# Patient Record
Sex: Female | Born: 1985 | Race: White | Hispanic: No | Marital: Married | Smoking: Never smoker
Health system: Southern US, Community
[De-identification: ages and names within clinical notes are randomized; demographics above are authoritative.]

## PROBLEM LIST (undated history)

## (undated) DIAGNOSIS — K2970 Gastritis, unspecified, without bleeding: Secondary | ICD-10-CM

## (undated) HISTORY — DX: Gastritis, unspecified, without bleeding: K29.70

---
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
## 2014-11-27 ENCOUNTER — Telehealth: Payer: Self-pay | Admitting: *Deleted

## 2014-11-27 DIAGNOSIS — O219 Vomiting of pregnancy, unspecified: Secondary | ICD-10-CM

## 2014-11-27 MED ORDER — DOXYLAMINE-PYRIDOXINE 10-10 MG PO TBEC: 2.0000 | DELAYED_RELEASE_TABLET | Freq: Every evening | ORAL | Status: AC

## 2014-11-27 NOTE — Telephone Encounter (Signed)
Pt is scheduled for a NOB intake and wants something for nausea.  RX for Diclegis sent to [facility] pharmacy

## 2014-12-12 ENCOUNTER — Ambulatory Visit (INDEPENDENT_AMBULATORY_CARE_PROVIDER_SITE_OTHER): Payer: BLUE CROSS/BLUE SHIELD

## 2014-12-12 ENCOUNTER — Other Ambulatory Visit: Payer: Self-pay | Admitting: Advanced Practice Midwife

## 2014-12-12 ENCOUNTER — Ambulatory Visit (INDEPENDENT_AMBULATORY_CARE_PROVIDER_SITE_OTHER): Payer: BLUE CROSS/BLUE SHIELD | Admitting: Advanced Practice Midwife

## 2014-12-12 ENCOUNTER — Encounter: Payer: Self-pay | Admitting: Advanced Practice Midwife

## 2014-12-12 VITALS — BP 129/83 | HR 113 | Ht 67.0 in | Wt 146.0 lb

## 2014-12-12 DIAGNOSIS — O208 Other hemorrhage in early pregnancy: Secondary | ICD-10-CM | POA: Diagnosis not present

## 2014-12-12 DIAGNOSIS — O09891 Supervision of other high risk pregnancies, first trimester: Secondary | ICD-10-CM | POA: Diagnosis not present

## 2014-12-12 DIAGNOSIS — Z3689 Encounter for other specified antenatal screening: Secondary | ICD-10-CM

## 2014-12-12 DIAGNOSIS — O30031 Twin pregnancy, monochorionic/diamniotic, first trimester: Secondary | ICD-10-CM

## 2014-12-12 DIAGNOSIS — Z3A01 Less than 8 weeks gestation of pregnancy: Secondary | ICD-10-CM

## 2014-12-12 DIAGNOSIS — Z3201 Encounter for pregnancy test, result positive: Secondary | ICD-10-CM

## 2014-12-12 DIAGNOSIS — Z3491 Encounter for supervision of normal pregnancy, unspecified, first trimester: Secondary | ICD-10-CM

## 2014-12-12 DIAGNOSIS — O09899 Supervision of other high risk pregnancies, unspecified trimester: Secondary | ICD-10-CM | POA: Insufficient documentation

## 2014-12-12 DIAGNOSIS — Z36 Encounter for antenatal screening of mother: Secondary | ICD-10-CM

## 2014-12-12 LAB — US OB COMP ADDL GEST LESS 14 WKS

## 2014-12-12 NOTE — Patient Instructions (Signed)
First Trimester of Pregnancy The first trimester of pregnancy is from week 1 until the end of week 12 (months 1 through 3). A week after a sperm fertilizes an egg, the egg will implant on the wall of the uterus. This embryo will begin to develop into a baby. Genes from you and your partner are forming the baby. The female genes determine whether the baby is a boy or a girl. At 6-8 weeks, the eyes and face are formed, and the heartbeat can be seen on ultrasound. At the end of 12 weeks, all the baby's organs are formed.  Now that you are pregnant, you will want to do everything you can to have a healthy baby. Two of the most important things are to get good prenatal care and to follow your health care provider's instructions. Prenatal care is all the medical care you receive before the baby's birth. This care will help prevent, find, and treat any problems during the pregnancy and childbirth. BODY CHANGES Your body goes through many changes during pregnancy. The changes vary from woman to woman.   You may gain or lose a couple of pounds at first.  You may feel sick to your stomach (nauseous) and throw up (vomit). If the vomiting is uncontrollable, call your health care provider.  You may tire easily.  You may develop headaches that can be relieved by medicines approved by your health care provider.  You may urinate more often. Painful urination may mean you have a bladder infection.  You may develop heartburn as a result of your pregnancy.  You may develop constipation because certain hormones are causing the muscles that push waste through your intestines to slow down.  You may develop hemorrhoids or swollen, bulging veins (varicose veins).  Your breasts may begin to grow larger and become tender. Your nipples may stick out more, and the tissue that surrounds them (areola) may become darker.  Your gums may bleed and may be sensitive to brushing and flossing.  Dark spots or blotches (chloasma,  mask of pregnancy) may develop on your face. This will likely fade after the baby is born.  Your menstrual periods will stop.  You may have a loss of appetite.  You may develop cravings for certain kinds of food.  You may have changes in your emotions from day to day, such as being excited to be pregnant or being concerned that something may go wrong with the pregnancy and baby.  You may have more vivid and strange dreams.  You may have changes in your hair. These can include thickening of your hair, rapid growth, and changes in texture. Some women also have hair loss during or after pregnancy, or hair that feels dry or thin. Your hair will most likely return to normal after your baby is born. WHAT TO EXPECT AT YOUR PRENATAL VISITS During a routine prenatal visit:  You will be weighed to make sure you and the baby are growing normally.  Your blood pressure will be taken.  Your abdomen will be measured to track your baby's growth.  The fetal heartbeat will be listened to starting around week 10 or 12 of your pregnancy.  Test results from any previous visits will be discussed. Your health care provider may ask you:  How you are feeling.  If you are feeling the baby move.  If you have had any abnormal symptoms, such as leaking fluid, bleeding, severe headaches, or abdominal cramping.  If you have any questions. Other tests   that may be performed during your first trimester include:  Blood tests to find your blood type and to check for the presence of any previous infections. They will also be used to check for low iron levels (anemia) and Rh antibodies. Later in the pregnancy, blood tests for diabetes will be done along with other tests if problems develop.  Urine tests to check for infections, diabetes, or protein in the urine.  An ultrasound to confirm the proper growth and development of the baby.  An amniocentesis to check for possible genetic problems.  Fetal screens for  spina bifida and Down syndrome.  You may need other tests to make sure you and the baby are doing well. HOME CARE INSTRUCTIONS  Medicines  Follow your health care provider's instructions regarding medicine use. Specific medicines may be either safe or unsafe to take during pregnancy.  Take your prenatal vitamins as directed.  If you develop constipation, try taking a stool softener if your health care provider approves. Diet  Eat regular, well-balanced meals. Choose a variety of foods, such as meat or vegetable-based protein, fish, milk and low-fat dairy products, vegetables, fruits, and whole grain breads and cereals. Your health care provider will help you determine the amount of weight gain that is right for you.  Avoid raw meat and uncooked cheese. These carry germs that can cause birth defects in the baby.  Eating four or five small meals rather than three large meals a day may help relieve nausea and vomiting. If you start to feel nauseous, eating a few soda crackers can be helpful. Drinking liquids between meals instead of during meals also seems to help nausea and vomiting.  If you develop constipation, eat more high-fiber foods, such as fresh vegetables or fruit and whole grains. Drink enough fluids to keep your urine clear or pale yellow. Activity and Exercise  Exercise only as directed by your health care provider. Exercising will help you:  Control your weight.  Stay in shape.  Be prepared for labor and delivery.  Experiencing pain or cramping in the lower abdomen or low back is a good sign that you should stop exercising. Check with your health care provider before continuing normal exercises.  Try to avoid standing for long periods of time. Move your legs often if you must stand in one place for a long time.  Avoid heavy lifting.  Wear low-heeled shoes, and practice good posture.  You may continue to have sex unless your health care provider directs you  otherwise. Relief of Pain or Discomfort  Wear a good support bra for breast tenderness.   Take warm sitz baths to soothe any pain or discomfort caused by hemorrhoids. Use hemorrhoid cream if your health care provider approves.   Rest with your legs elevated if you have leg cramps or low back pain.  If you develop varicose veins in your legs, wear support hose. Elevate your feet for 15 minutes, 3-4 times a day. Limit salt in your diet. Prenatal Care  Schedule your prenatal visits by the twelfth week of pregnancy. They are usually scheduled monthly at first, then more often in the last 2 months before delivery.  Write down your questions. Take them to your prenatal visits.  Keep all your prenatal visits as directed by your health care provider. Safety  Wear your seat belt at all times when driving.  Make a list of emergency phone numbers, including numbers for family, friends, the hospital, and police and fire departments. General Tips    Ask your health care provider for a referral to a local prenatal education class. Begin classes no later than at the beginning of month 6 of your pregnancy.  Ask for help if you have counseling or nutritional needs during pregnancy. Your health care provider can offer advice or refer you to specialists for help with various needs.  Do not use hot tubs, steam rooms, or saunas.  Do not douche or use tampons or scented sanitary pads.  Do not cross your legs for long periods of time.  Avoid cat litter boxes and soil used by cats. These carry germs that can cause birth defects in the baby and possibly loss of the fetus by miscarriage or stillbirth.  Avoid all smoking, herbs, alcohol, and medicines not prescribed by your health care provider. Chemicals in these affect the formation and growth of the baby.  Schedule a dentist appointment. At home, brush your teeth with a soft toothbrush and be gentle when you floss. SEEK MEDICAL CARE IF:   You have  dizziness.  You have mild pelvic cramps, pelvic pressure, or nagging pain in the abdominal area.  You have persistent nausea, vomiting, or diarrhea.  You have a bad smelling vaginal discharge.  You have pain with urination.  You notice increased swelling in your face, hands, legs, or ankles. SEEK IMMEDIATE MEDICAL CARE IF:   You have a fever.  You are leaking fluid from your vagina.  You have spotting or bleeding from your vagina.  You have severe abdominal cramping or pain.  You have rapid weight gain or loss.  You vomit blood or material that looks like coffee grounds.  You are exposed to [place] measles and have never had them.  You are exposed to fifth disease or chickenpox.  You develop a severe headache.  You have shortness of breath.  You have any kind of trauma, such as from a fall or a car accident. Document Released: 08/16/2001 Document Revised: 01/06/2014 Document Reviewed: 07/02/2013 [facility] Patient Information 2015 [place], [place]. This information is not intended to replace advice given to you by your health care provider. Make sure you discuss any questions you have with your health care provider.  Eating Plan for Hyperemesis Gravidarum Severe cases of hyperemesis gravidarum can lead to dehydration and malnutrition. The hyperemesis eating plan is one way to lessen the symptoms of nausea and vomiting. It is often used with prescribed medicines to control your symptoms.  WHAT CAN I DO TO RELIEVE MY SYMPTOMS? Listen to your body. Everyone is different and has different preferences. Find what works best for you. Some of the following things may help:  Eat and drink slowly.  Eat 5-6 small meals daily instead of 3 large meals.   Eat crackers before you get out of bed in the morning.   Starchy foods are usually well tolerated (such as cereal, toast, bread, potatoes, pasta, rice, and pretzels).   Ginger may help with nausea. Add  tsp ground ginger to hot  tea or choose ginger tea.   Try drinking 100% fruit juice or an electrolyte drink.  Continue to take your prenatal vitamins as directed by your health care provider. If you are having trouble taking your prenatal vitamins, talk with your health care provider about different options.  Include at least 1 serving of protein with your meals and snacks (such as meats or poultry, beans, nuts, eggs, or yogurt). Try eating a protein-rich snack before bed (such as cheese and crackers or a half [place] or  butter sandwich). °WHAT THINGS SHOULD I AVOID TO REDUCE MY SYMPTOMS? °The following things may help reduce your symptoms: °· Avoid foods with strong smells. Try eating meals in well-ventilated areas that are free of odors. °· Avoid drinking water or other beverages with meals. Try not to drink anything less than 30 minutes before and after meals. °· Avoid drinking more than 1 cup of fluid at a time. °· Avoid fried or high-fat foods, such as butter and cream sauces. °· Avoid spicy foods. °· Avoid skipping meals the best you can. Nausea can be more intense on an empty stomach. If you cannot tolerate food at that time, do not force it. Try sucking on ice chips or other frozen items and make up the calories later. °· Avoid lying down within 2 hours after eating. °Document Released: 06/19/2007 Document Revised: 08/27/2013 Document Reviewed: 06/26/2013 °ExitCare® Patient Information ©2015 [employer] LLC. This information is not intended to replace advice given to you by your health care provider. Make sure you discuss any questions you have with your health care provider. ° °

## 2014-12-12 NOTE — Progress Notes (Signed)
   Subjective:    Molly Scott is a [id number] [id number] by certain LMP being seen today for her first obstetrical visit.  Her obstetrical history is significant for C/S x 2. First one for NRFHTs. Second one planned repeat. . Patient does intend to breast feed. Pregnancy history fully reviewed.  Patient reports nausea, no bleeding, no cramping and vomiting. N/V now adequately controlled w/ Diclegis.   Pap < 1 year ago, normal.  Filed Vitals:   12/12/14 0923 12/12/14 0943  BP: 129/83   Pulse: 113   Height:  5\' 7"  (1.702 m)  Weight: 146 lb (66.225 kg)     HISTORY: OB History  Gravida Para Term Preterm AB SAB TAB Ectopic Multiple Living  3 2 2       2     # Outcome Date GA Lbr Len/2nd Weight Sex Delivery Anes PTL Lv  3 Current           2 Term 02/09/11 3349w0d  7 lb 2 oz (3.232 kg) M CS-Unspec   Y  1 Term 08/25/08 5366w0d  8 lb 2 oz (3.685 kg) F CS-Unspec  N Y     Past Medical History  Diagnosis Date  . Gastritis    Past Surgical History  Procedure Laterality Date  . Cesarean section      X2  . Tonsillectomy  2004   No family history on file.   Exam    Uterus:   Possible twin gestation. FHTs x 2  Pelvic Exam:    Perineum: No Hemorrhoids, Normal Perineum   Vulva: normal   Vagina:  Not examined   pH: NA   Cervix: Not examined   Adnexa: not evaluated   Bony Pelvis: Unproven  System: Breast:  normal appearance, no masses. Normal tenderness for pregnancy.   Skin: normal coloration and turgor, no rashes    Neurologic: oriented, normal mood, grossly non-focal   Extremities: normal strength, tone, and muscle mass   HEENT sclera clear, anicteric and thyroid without masses   Mouth/Teeth dental hygiene good   Neck supple and no masses   Cardiovascular: regular rate and rhythm, no murmurs or gallops   Respiratory:  appears well, vitals normal, no respiratory distress, acyanotic, normal RR, chest clear, no wheezing, crepitations, rhonchi, normal symmetric air entry   Abdomen:  soft, non-tender; bowel sounds normal; no masses,  no organomegaly   Urinary: urethral meatus normal      Assessment:    Pregnancy: G3P2002 1. Establish gestational age, ultrasound   - US OB Comp Less 14 Wks; Future - US OB Transvaginal; Future - Prenatal (OB Panel) - HIV antibody  2. Normal pregnancy in first trimester   - Culture, OB Urine - GC/Chlamydia Probe Amp  3. N/V of pregnancy   Plan:   Initial labs drawn. Prenatal vitamins. Problem list reviewed and updated. Genetic Screening discussed First Screen: declined. Ultrasound discussed; Dating/verify twins: ordered. Follow up in 4 weeks. Plan C/S due to C/S x 2, no Vaginal deliveries.    Molly Scott, Molly Scott 12/12/2014

## 2014-12-12 NOTE — Progress Notes (Signed)
Initial prenatal visit. Pt has not had a cycle for 4 years until 10/18/14. Bedside US shows 2 FHR. FHR of 166 and 164. CRL of 12.559mm-[redacted]w[redacted]d and 17.311mm-[redacted]w[redacted]d. Will send pt for formal US.

## 2014-12-13 LAB — HIV ANTIBODY (ROUTINE TESTING W REFLEX): HIV 1&2 Ab, 4th Generation: NONREACTIVE

## 2014-12-14 LAB — CULTURE, OB URINE
Colony Count: NO GROWTH
Organism ID, Bacteria: NO GROWTH

## 2014-12-15 DIAGNOSIS — O30031 Twin pregnancy, monochorionic/diamniotic, first trimester: Secondary | ICD-10-CM | POA: Insufficient documentation

## 2014-12-15 LAB — GC/CHLAMYDIA PROBE AMP
CT PROBE, AMP APTIMA: NEGATIVE
GC Probe RNA: NEGATIVE

## 2014-12-15 LAB — OBSTETRIC PANEL
Antibody Screen: NEGATIVE
BASOS ABS: 0 10*3/uL (ref 0.0–0.1)
Basophils Relative: 0 % (ref 0–1)
Eosinophils Absolute: 0 10*3/uL (ref 0.0–0.7)
Eosinophils Relative: 0 % (ref 0–5)
HEMATOCRIT: 39.3 % (ref 36.0–46.0)
HEP B S AG: NEGATIVE
Hemoglobin: 13.5 g/dL (ref 12.0–15.0)
LYMPHS ABS: 1.5 10*3/uL (ref 0.7–4.0)
Lymphocytes Relative: 18 % (ref 12–46)
MCH: 30.8 pg (ref 26.0–34.0)
MCHC: 34.4 g/dL (ref 30.0–36.0)
MCV: 89.5 fL (ref 78.0–100.0)
MONOS PCT: 7 % (ref 3–12)
MPV: 9.9 fL (ref 8.6–12.4)
Monocytes Absolute: 0.6 10*3/uL (ref 0.1–1.0)
Neutro Abs: 6.3 10*3/uL (ref 1.7–7.7)
Neutrophils Relative %: 75 % (ref 43–77)
Platelets: 222 10*3/uL (ref 150–400)
RBC: 4.39 MIL/uL (ref 3.87–5.11)
RDW: 13.1 % (ref 11.5–15.5)
RH TYPE: POSITIVE
Rubella: 18.8 Index — ABNORMAL HIGH (ref <0.90)
WBC: 8.4 10*3/uL (ref 4.0–10.5)

## 2014-12-22 ENCOUNTER — Telehealth: Payer: Self-pay | Admitting: *Deleted

## 2014-12-22 DIAGNOSIS — O219 Vomiting of pregnancy, unspecified: Secondary | ICD-10-CM

## 2014-12-22 MED ORDER — ONDANSETRON HCL 4 MG PO TABS: 4.0000 mg | ORAL_TABLET | Freq: Three times a day (TID) | ORAL | Status: AC | PRN

## 2014-12-22 NOTE — Telephone Encounter (Signed)
Pt requesting Zofran instead of Diclegis.  She states that it does not help her.  Spoke with Dr Marice Potterove who authorized the new RX.

## 2014-12-26 ENCOUNTER — Encounter: Payer: Self-pay | Admitting: Advanced Practice Midwife

## 2014-12-29 NOTE — Telephone Encounter (Signed)
Pt notified about her question concerning her subchorionic hemorrhage.  Suggest no heavy lifting and to call if any bleeding.  Explained that this will usually resolve itself by the second trimester but will follow her closer with U/S

## 2015-01-02 ENCOUNTER — Telehealth: Payer: Self-pay

## 2015-01-02 NOTE — Telephone Encounter (Signed)
Per patient request transfer records to [facility] in [place]. Faxed records.

## 2015-01-09 ENCOUNTER — Encounter: Payer: BLUE CROSS/BLUE SHIELD | Admitting: Advanced Practice Midwife

## 2015-10-17 ENCOUNTER — Encounter (HOSPITAL_COMMUNITY): Payer: Self-pay | Admitting: *Deleted

## 2016-08-20 IMAGING — US US OB COMP LESS 14 WK
1 series · 14 of 28 positions shown · non-contrast
Comparison: None.

CLINICAL DATA: Positive pregnancy test. Twin pregnancy. Gestational
age by LMP of 07/25/2015

EXAM:
TWIN OBSTETRIC <14WK US AND TRANSVAGINAL OB US

[Series 1: us ob comp less 14 wk · 0.20mm/px · 14 of 62 slices shown]
[im 3/62]
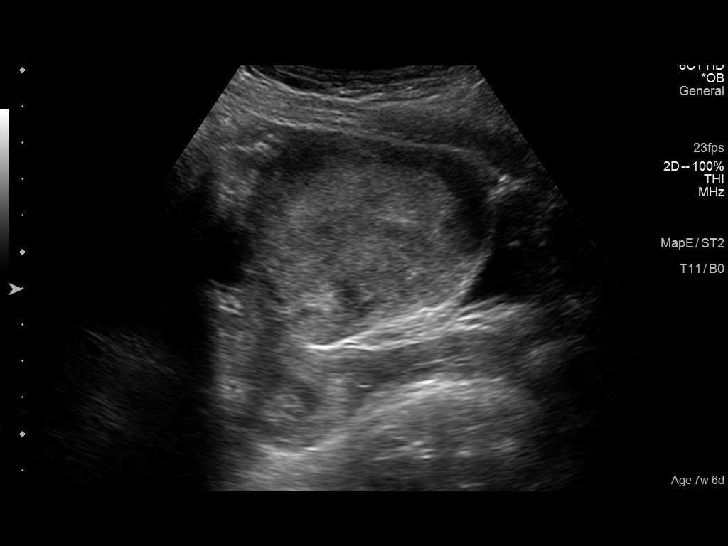
[im 7/62]
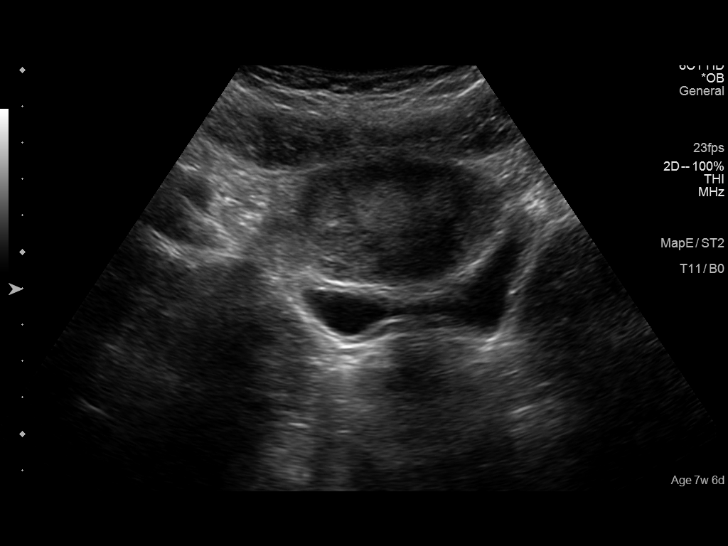
[im 12/62]
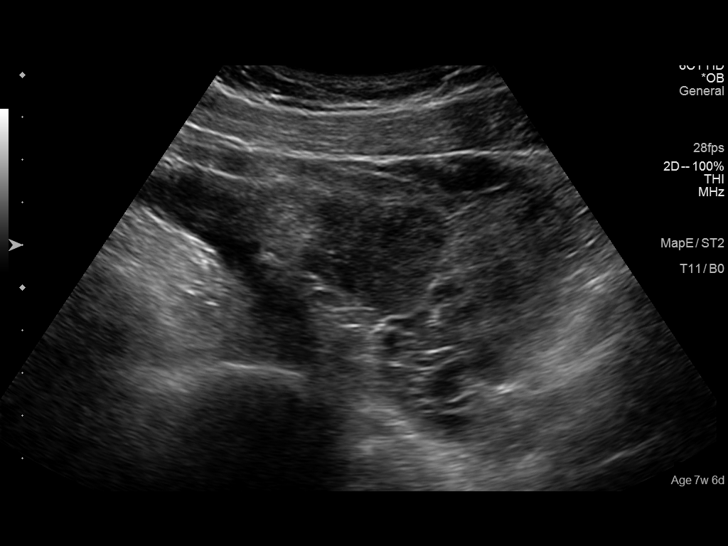
[im 16/62]
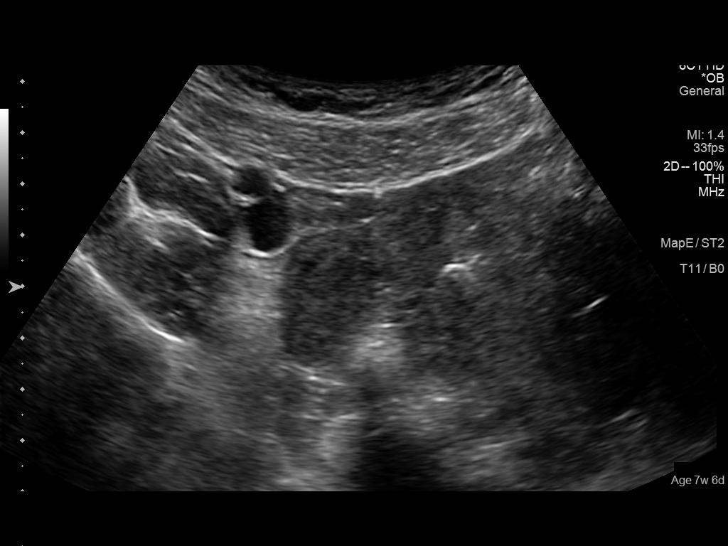
[im 21/62]
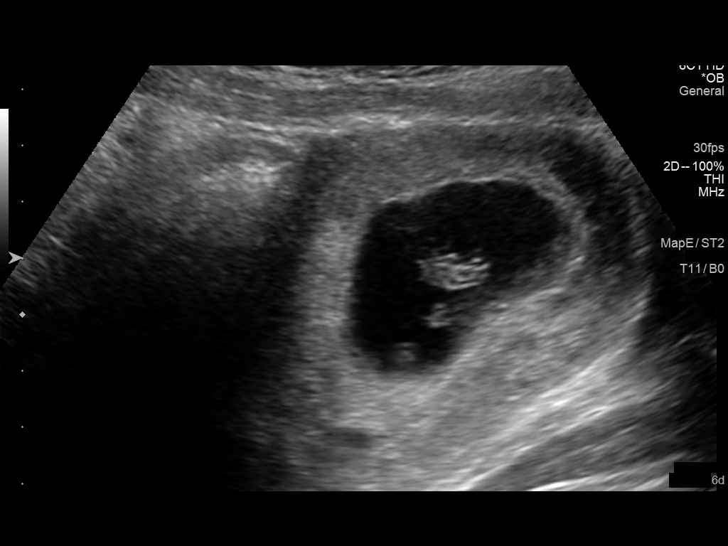
[im 25/62]
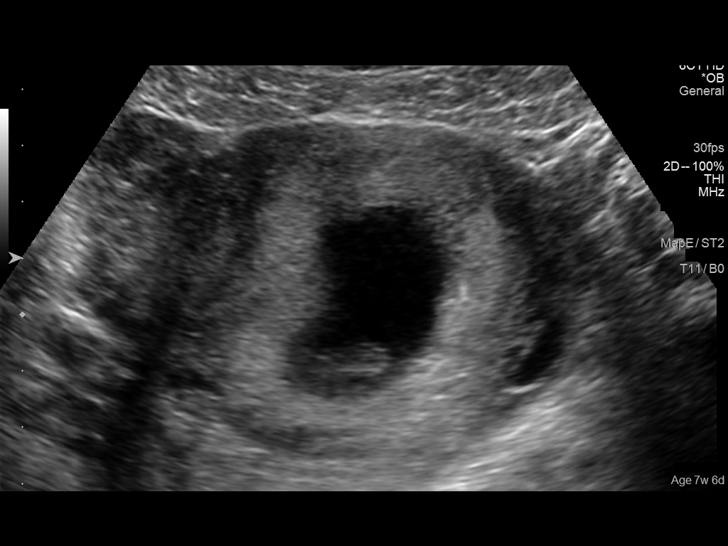
[im 30/62]
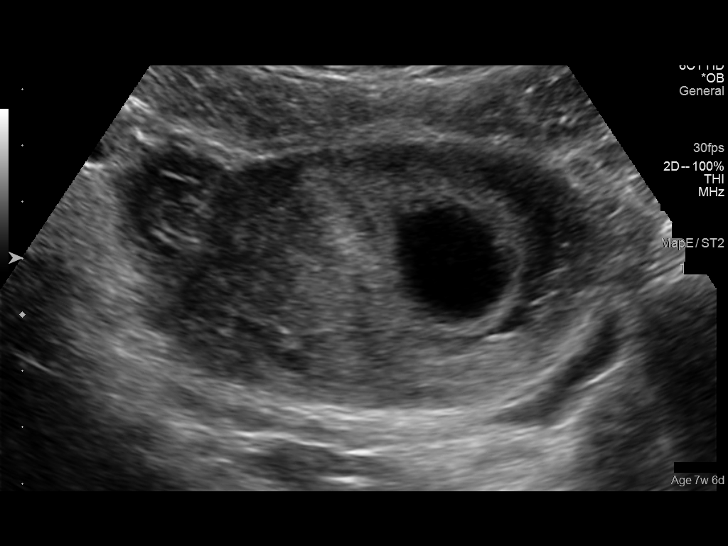
[im 34/62]
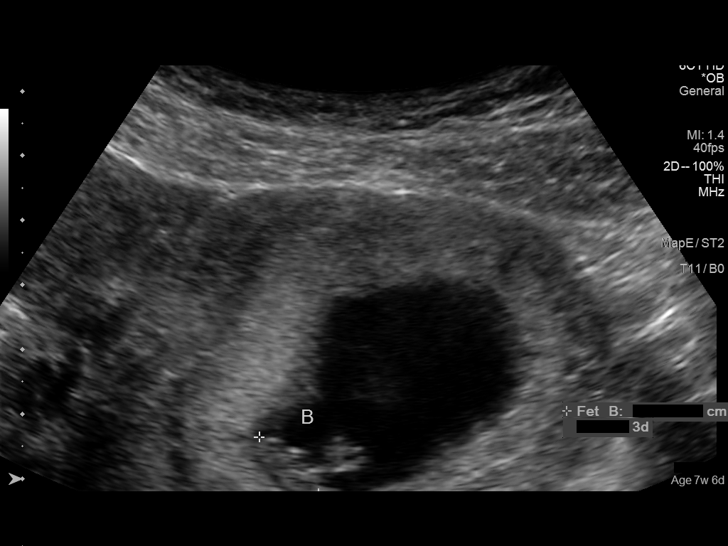
[im 39/62]
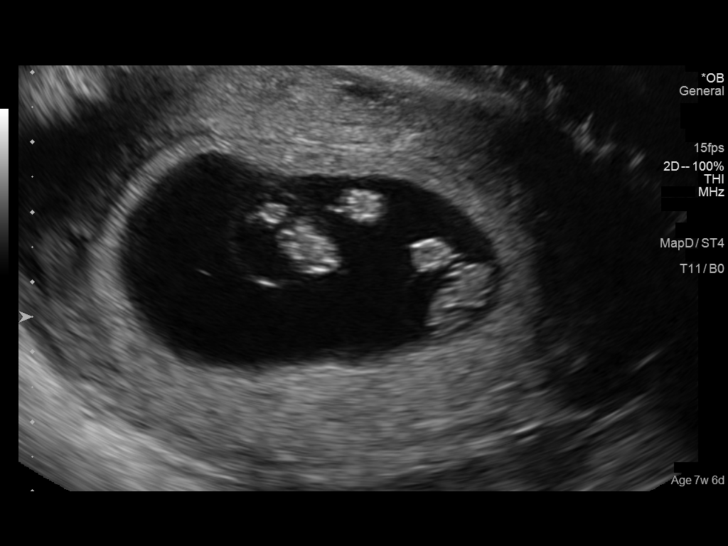
[im 43/62]
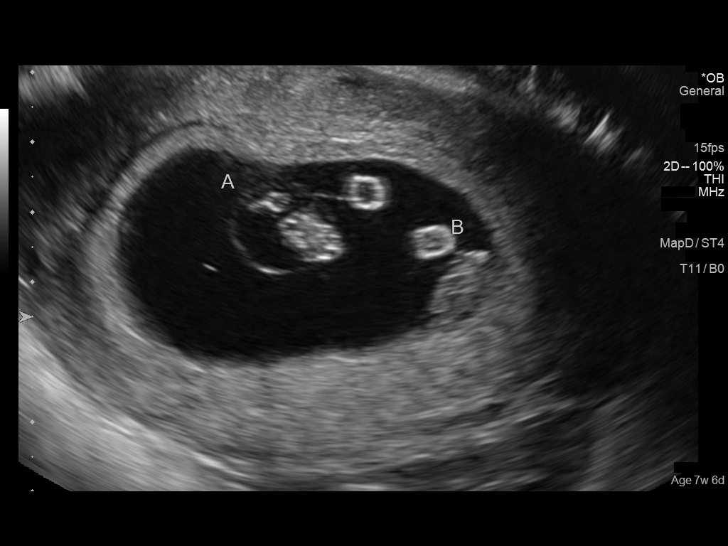
[im 48/62]
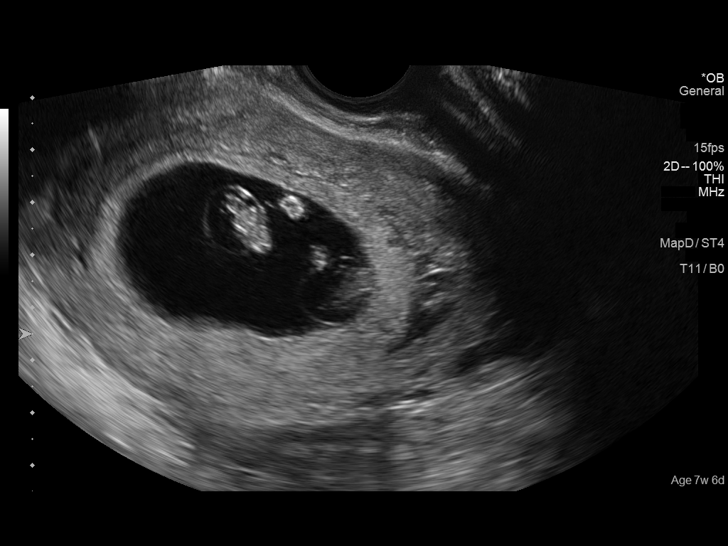
[im 52/62]
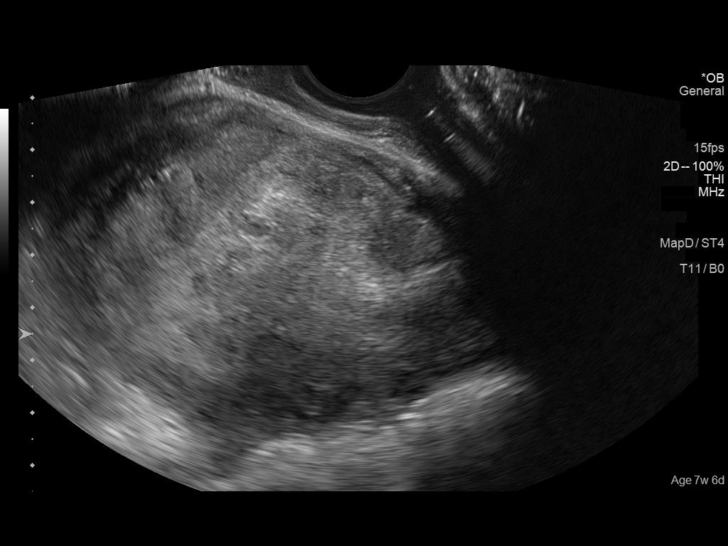
[im 57/62]
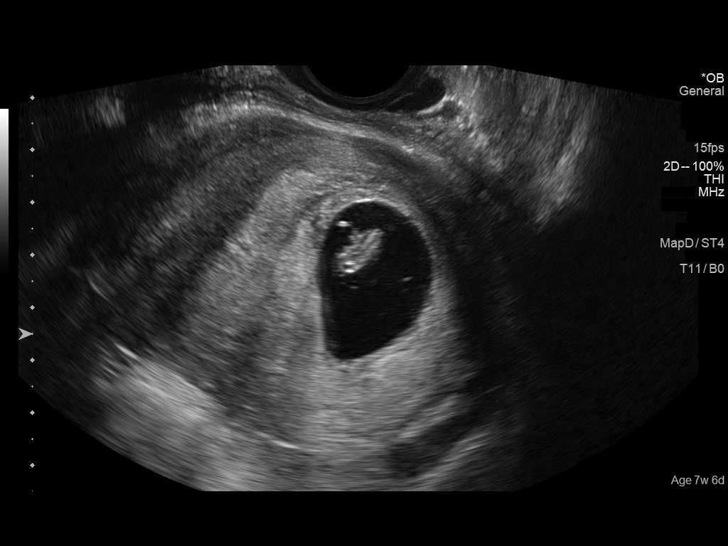
[im 62/62]
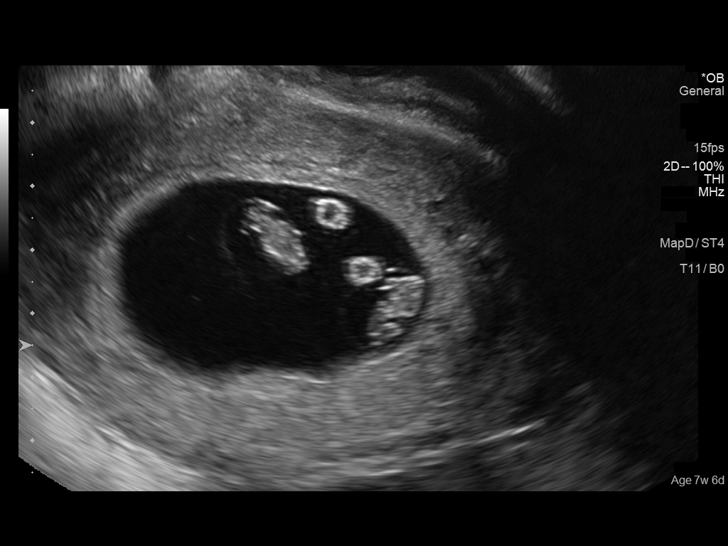

[14 of 28 positions shown; findings below may reference images not displayed]

FINDINGS: A monochorionic, diamniotic twin IUP is seen.

TWIN 1

Intrauterine gestational sac: Visualized/normal in shape.

Yolk sac:  Visualized

Embryo:  Visualized

Cardiac Activity: Visualized

Heart Rate: 168 bpm

CRL:  14  mm   7 w 5 d                  US EDC: 07/26/2015

TWIN 2

Intrauterine gestational sac: Visualized/normal in shape.

Yolk sac:  Visualized

Embryo:  Visualized

Cardiac Activity: Visualized

Heart Rate: 165 bpm

CRL:  13  mm   7 w 4 d                  US EDC: 07/27/2015

Maternal uterus/adnexae: A small subchorionic hemorrhage is noted.
Both ovaries are normal in appearance. No adnexal mass free fluid
identified.
IMPRESSION: Living monochorionic, diamniotic twin IUP, with estimated
gestational age of 7 weeks 5 days and US EDC of 07/26/2015.

Small subchorionic hemorrhage noted.
